# Patient Record
Sex: Female | Born: 1955 | Race: White | Hispanic: No | Marital: Married | Smoking: Never smoker
Health system: Southern US, Community
[De-identification: ages and names within clinical notes are randomized; demographics above are authoritative.]

## PROBLEM LIST (undated history)

## (undated) DIAGNOSIS — E119 Type 2 diabetes mellitus without complications: Secondary | ICD-10-CM

## (undated) DIAGNOSIS — N289 Disorder of kidney and ureter, unspecified: Secondary | ICD-10-CM

## (undated) DIAGNOSIS — D649 Anemia, unspecified: Secondary | ICD-10-CM

## (undated) DIAGNOSIS — J45909 Unspecified asthma, uncomplicated: Secondary | ICD-10-CM

## (undated) DIAGNOSIS — I10 Essential (primary) hypertension: Secondary | ICD-10-CM

## (undated) DIAGNOSIS — M1990 Unspecified osteoarthritis, unspecified site: Secondary | ICD-10-CM

## (undated) DIAGNOSIS — M549 Dorsalgia, unspecified: Secondary | ICD-10-CM

## (undated) HISTORY — PX: REPLACEMENT TOTAL KNEE: SUR1224

## (undated) HISTORY — PX: TOTAL KNEE ARTHROPLASTY: SHX125

## (undated) HISTORY — PX: OTHER SURGICAL HISTORY: SHX169

## (undated) HISTORY — PX: GASTRIC BYPASS: SHX52

## (undated) HISTORY — PX: ABDOMINAL HYSTERECTOMY: SHX81

---

## 2004-07-30 ENCOUNTER — Ambulatory Visit: Payer: Self-pay | Admitting: Unknown Physician Specialty

## 2005-08-18 ENCOUNTER — Emergency Department: Payer: Self-pay | Admitting: Emergency Medicine

## 2006-06-02 ENCOUNTER — Ambulatory Visit: Payer: Self-pay | Admitting: Unknown Physician Specialty

## 2006-06-26 ENCOUNTER — Ambulatory Visit: Payer: Self-pay | Admitting: Unknown Physician Specialty

## 2006-07-27 ENCOUNTER — Ambulatory Visit: Payer: Self-pay | Admitting: Unknown Physician Specialty

## 2006-12-28 ENCOUNTER — Ambulatory Visit: Payer: Self-pay | Admitting: Unknown Physician Specialty

## 2007-01-09 ENCOUNTER — Emergency Department: Payer: Self-pay

## 2007-04-17 IMAGING — RF DG UGI W/ KUB
1 series · 15 of 24 positions shown · non-contrast
Comparison: none

REASON FOR EXAM: GERD
COMMENTS:

[Series 1: run · 15 of 24 slices shown]
[im 1/24]
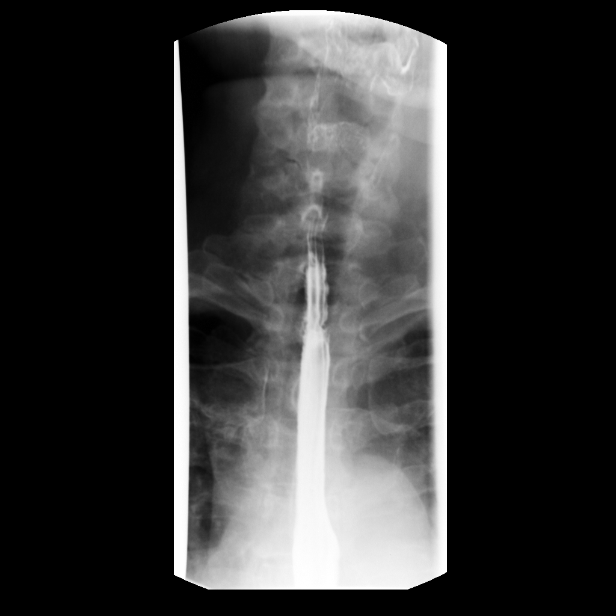
[im 3/24]
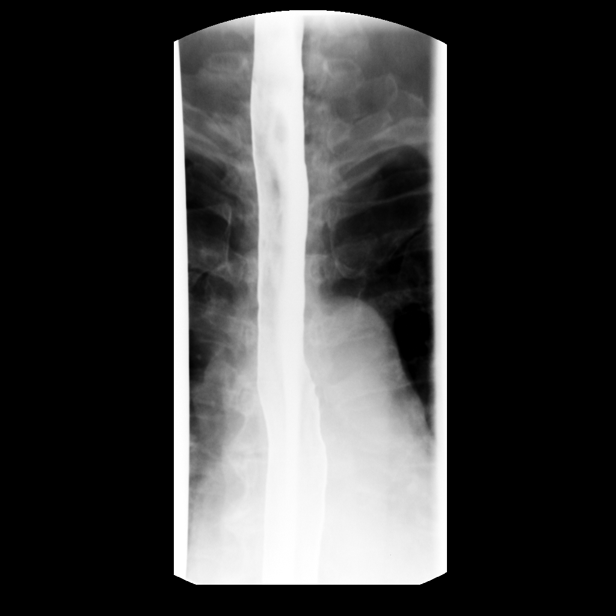
[im 5/24]
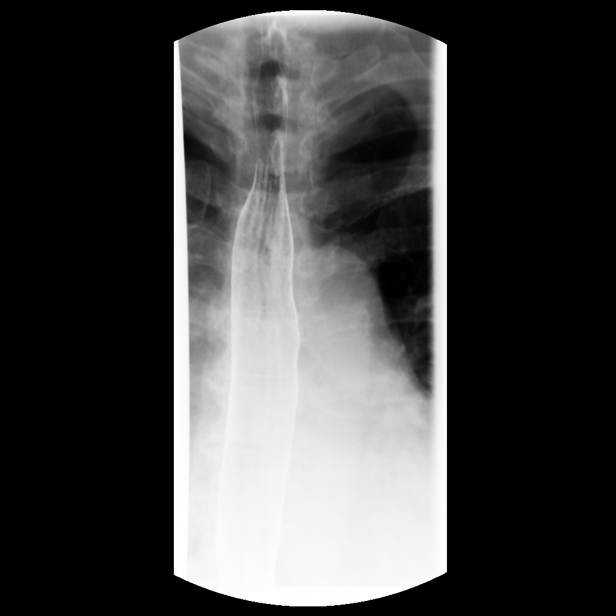
[im 6/24]
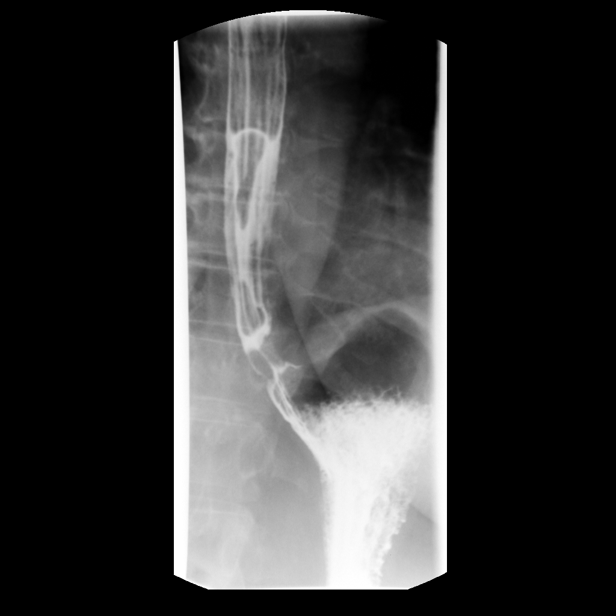
[im 8/24]
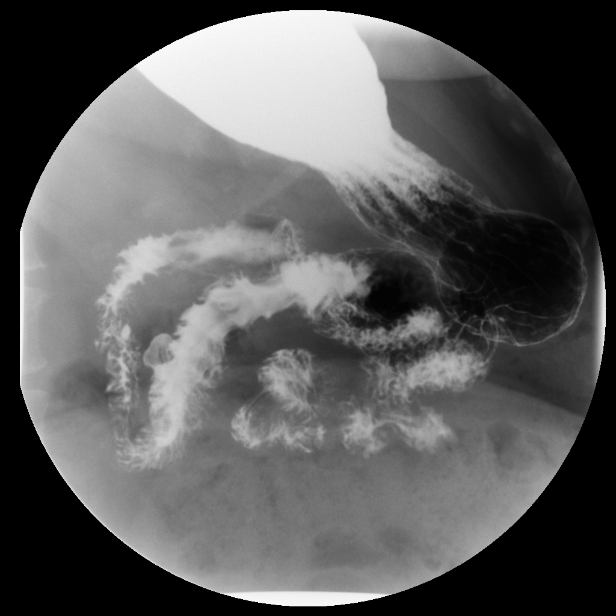
[im 9/24]
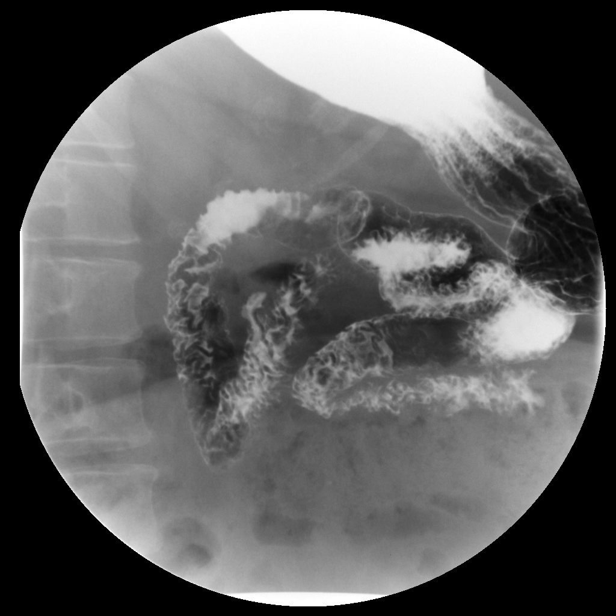
[im 11/24]
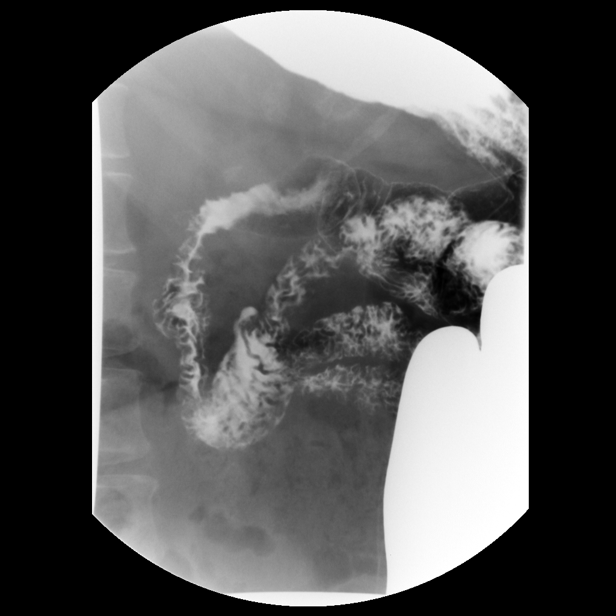
[im 13/24]
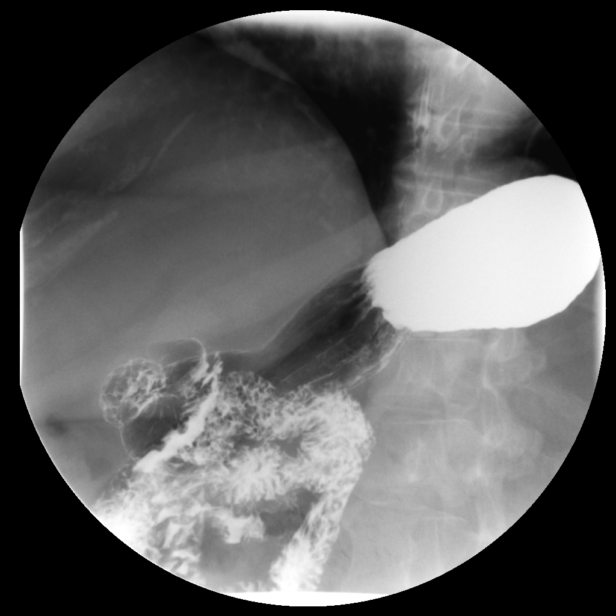
[im 14/24]
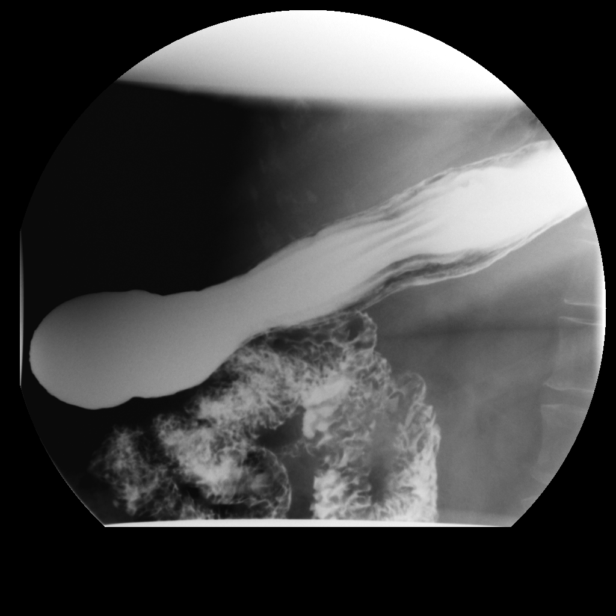
[im 16/24]
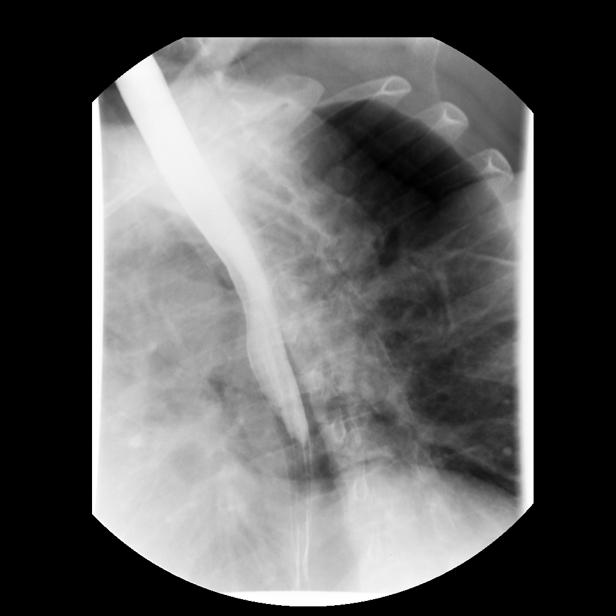
[im 17/24]
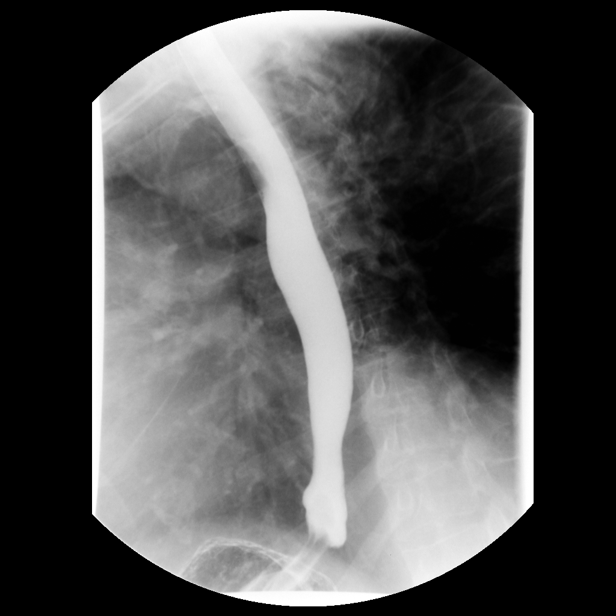
[im 19/24]
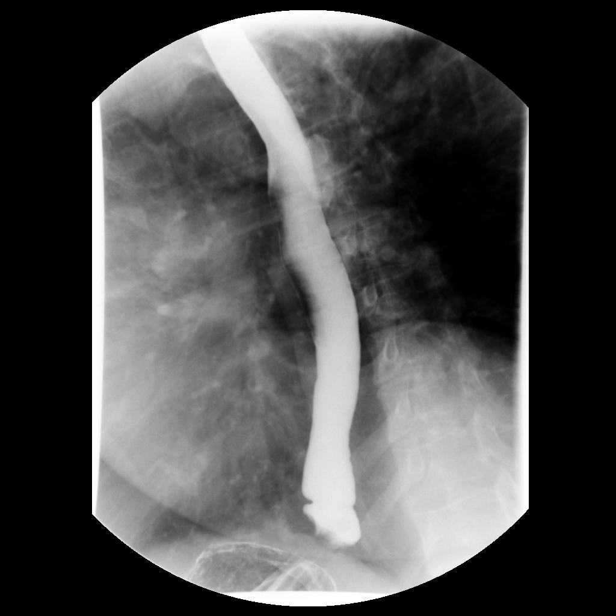
[im 21/24]
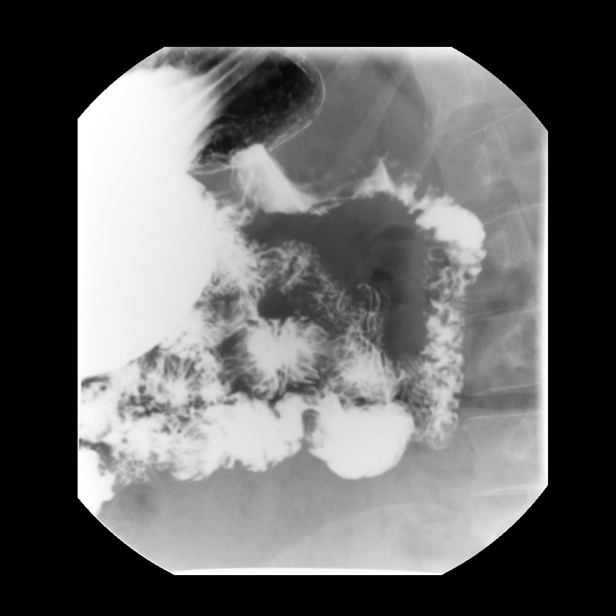
[im 22/24]
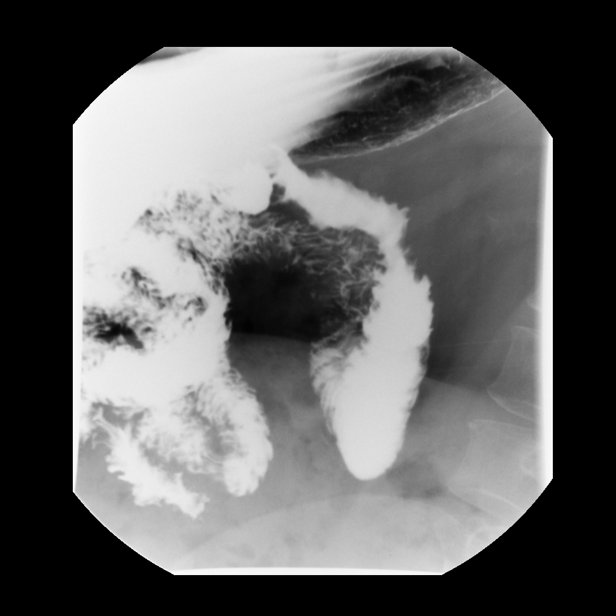
[im 24/24]
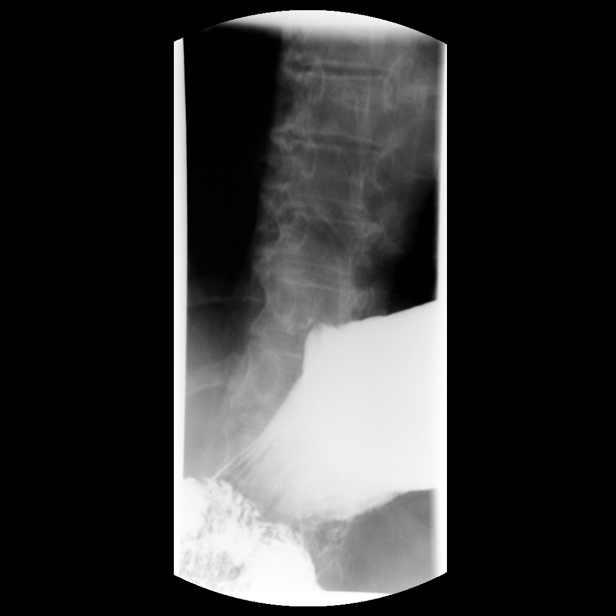

[15 of 24 positions shown; findings below may reference images not displayed]

PROCEDURE:     FL  - FL UPPER GI W/ BARIUM SWALLOW  - June 26, 2006  [DATE]

RESULT:     Barium passed through the esophagus unobstructed with normal
peristaltic activity.  Small intermittent hiatal hernia is identified.  A
small amount of gastroesophageal reflux is noted on water siphon exam.  A
12.5-mm barium impregnated tablet passed through the esophagus into the
stomach unobstructed.

The stomach appears within normal limits.  No ulcer craters are seen. The
stomach emptied without delay.  Duodenal bulb appears within normal limits.
No ulcer craters.  There is seen a small diverticulum in the proximal
transverse duodenum.
IMPRESSION: Small intermittent direct sliding-type hiatal hernia with minimal amount of
gastroesophageal reflux on the water siphon exam.

No ulcer crater is seen.

Small diverticulum in the proximal transverse duodenum.

## 2007-07-01 ENCOUNTER — Ambulatory Visit: Payer: Self-pay | Admitting: Unknown Physician Specialty

## 2007-07-09 ENCOUNTER — Ambulatory Visit: Payer: Self-pay | Admitting: Family Medicine

## 2007-07-09 ENCOUNTER — Other Ambulatory Visit: Payer: Self-pay

## 2007-08-09 ENCOUNTER — Ambulatory Visit: Payer: Self-pay | Admitting: Internal Medicine

## 2007-12-11 ENCOUNTER — Ambulatory Visit: Payer: Self-pay | Admitting: Unknown Physician Specialty

## 2008-06-12 ENCOUNTER — Ambulatory Visit: Payer: Self-pay | Admitting: Internal Medicine

## 2009-01-31 ENCOUNTER — Ambulatory Visit: Payer: Self-pay | Admitting: Internal Medicine

## 2009-04-27 ENCOUNTER — Ambulatory Visit: Payer: Self-pay | Admitting: Unknown Physician Specialty

## 2009-12-09 ENCOUNTER — Ambulatory Visit: Payer: Self-pay | Admitting: Family Medicine

## 2010-05-09 ENCOUNTER — Ambulatory Visit: Payer: Self-pay | Admitting: Unknown Physician Specialty

## 2010-09-03 ENCOUNTER — Encounter
Admission: RE | Admit: 2010-09-03 | Discharge: 2010-09-03 | Payer: Self-pay | Source: Home / Self Care | Attending: Orthopedic Surgery | Admitting: Orthopedic Surgery

## 2011-03-07 ENCOUNTER — Ambulatory Visit: Payer: Self-pay | Admitting: Physical Medicine and Rehabilitation

## 2011-05-23 ENCOUNTER — Ambulatory Visit: Payer: Self-pay | Admitting: Unknown Physician Specialty

## 2011-06-19 ENCOUNTER — Ambulatory Visit: Payer: Self-pay | Admitting: Surgery

## 2011-06-25 ENCOUNTER — Ambulatory Visit: Payer: Self-pay | Admitting: Surgery

## 2011-09-17 ENCOUNTER — Ambulatory Visit: Payer: Self-pay | Admitting: Unknown Physician Specialty

## 2012-03-15 ENCOUNTER — Ambulatory Visit: Payer: Self-pay | Admitting: Unknown Physician Specialty

## 2012-05-28 ENCOUNTER — Ambulatory Visit: Payer: Self-pay | Admitting: Physical Medicine and Rehabilitation

## 2013-03-31 ENCOUNTER — Ambulatory Visit: Payer: Self-pay | Admitting: Unknown Physician Specialty

## 2013-06-29 ENCOUNTER — Ambulatory Visit: Payer: Self-pay | Admitting: Internal Medicine

## 2014-03-02 ENCOUNTER — Other Ambulatory Visit: Payer: Self-pay | Admitting: Physical Medicine and Rehabilitation

## 2014-03-02 DIAGNOSIS — M545 Low back pain, unspecified: Secondary | ICD-10-CM

## 2014-03-02 DIAGNOSIS — M79604 Pain in right leg: Secondary | ICD-10-CM

## 2014-03-09 ENCOUNTER — Ambulatory Visit
Admission: RE | Admit: 2014-03-09 | Discharge: 2014-03-09 | Disposition: A | Discharge status: Home / Self Care | Payer: Medicare Other | Source: Ambulatory Visit | Attending: Physical Medicine and Rehabilitation | Admitting: Physical Medicine and Rehabilitation

## 2014-03-09 ENCOUNTER — Other Ambulatory Visit: Payer: Self-pay

## 2014-03-09 DIAGNOSIS — M545 Low back pain, unspecified: Secondary | ICD-10-CM

## 2014-03-09 DIAGNOSIS — M79604 Pain in right leg: Secondary | ICD-10-CM

## 2015-04-30 ENCOUNTER — Encounter: Payer: Self-pay | Admitting: Emergency Medicine

## 2015-04-30 ENCOUNTER — Emergency Department
Admission: EM | Admit: 2015-04-30 | Discharge: 2015-05-01 | Disposition: A | Discharge status: Home / Self Care | Payer: Medicare Other | Source: Home / Self Care | Attending: Emergency Medicine | Admitting: Emergency Medicine

## 2015-04-30 ENCOUNTER — Ambulatory Visit
Admission: EM | Admit: 2015-04-30 | Discharge: 2015-04-30 | Disposition: A | Discharge status: Home / Self Care | Payer: Medicare Other | Attending: Family Medicine | Admitting: Family Medicine

## 2015-04-30 DIAGNOSIS — I10 Essential (primary) hypertension: Secondary | ICD-10-CM | POA: Insufficient documentation

## 2015-04-30 DIAGNOSIS — R42 Dizziness and giddiness: Secondary | ICD-10-CM | POA: Insufficient documentation

## 2015-04-30 DIAGNOSIS — I951 Orthostatic hypotension: Secondary | ICD-10-CM

## 2015-04-30 DIAGNOSIS — E860 Dehydration: Secondary | ICD-10-CM

## 2015-04-30 DIAGNOSIS — E119 Type 2 diabetes mellitus without complications: Secondary | ICD-10-CM | POA: Insufficient documentation

## 2015-04-30 DIAGNOSIS — E162 Hypoglycemia, unspecified: Secondary | ICD-10-CM | POA: Diagnosis not present

## 2015-04-30 DIAGNOSIS — Z794 Long term (current) use of insulin: Secondary | ICD-10-CM | POA: Diagnosis not present

## 2015-04-30 DIAGNOSIS — N289 Disorder of kidney and ureter, unspecified: Secondary | ICD-10-CM

## 2015-04-30 HISTORY — DX: Type 2 diabetes mellitus without complications: E11.9

## 2015-04-30 HISTORY — DX: Unspecified asthma, uncomplicated: J45.909

## 2015-04-30 HISTORY — DX: Dorsalgia, unspecified: M54.9

## 2015-04-30 HISTORY — DX: Essential (primary) hypertension: I10

## 2015-04-30 HISTORY — DX: Unspecified osteoarthritis, unspecified site: M19.90

## 2015-04-30 HISTORY — DX: Disorder of kidney and ureter, unspecified: N28.9

## 2015-04-30 HISTORY — DX: Anemia, unspecified: D64.9

## 2015-04-30 LAB — COMPREHENSIVE METABOLIC PANEL
ALK PHOS: 67 U/L (ref 38–126)
ALT: 17 U/L (ref 14–54)
ANION GAP: 10 (ref 5–15)
AST: 22 U/L (ref 15–41)
Albumin: 3.8 g/dL (ref 3.5–5.0)
BILIRUBIN TOTAL: 0.3 mg/dL (ref 0.3–1.2)
BUN: 27 mg/dL — ABNORMAL HIGH (ref 6–20)
CALCIUM: 9.4 mg/dL (ref 8.9–10.3)
CO2: 29 mmol/L (ref 22–32)
CREATININE: 1.75 mg/dL — AB (ref 0.44–1.00)
Chloride: 96 mmol/L — ABNORMAL LOW (ref 101–111)
GFR, EST AFRICAN AMERICAN: 36 mL/min — AB (ref >60)
GFR, EST NON AFRICAN AMERICAN: 31 mL/min — AB (ref >60)
Glucose, Bld: 218 mg/dL — ABNORMAL HIGH (ref 65–99)
Potassium: 3.7 mmol/L (ref 3.5–5.1)
SODIUM: 135 mmol/L (ref 135–145)
TOTAL PROTEIN: 7.5 g/dL (ref 6.5–8.1)

## 2015-04-30 LAB — URINALYSIS COMPLETE WITH MICROSCOPIC (ARMC ONLY)
Bilirubin Urine: NEGATIVE
Glucose, UA: NEGATIVE mg/dL
Hgb urine dipstick: NEGATIVE
KETONES UR: NEGATIVE mg/dL
LEUKOCYTES UA: NEGATIVE
Nitrite: NEGATIVE
PROTEIN: NEGATIVE mg/dL
RBC / HPF: NONE SEEN RBC/hpf (ref <3)
pH: 5 (ref 5.0–8.0)

## 2015-04-30 LAB — CBC WITH DIFFERENTIAL/PLATELET
Basophils Absolute: 0.1 10*3/uL (ref 0–0.1)
Basophils Relative: 1 %
EOS ABS: 0.2 10*3/uL (ref 0–0.7)
Eosinophils Relative: 2 %
HEMATOCRIT: 38 % (ref 35.0–47.0)
HEMOGLOBIN: 12.7 g/dL (ref 12.0–16.0)
LYMPHS ABS: 2.9 10*3/uL (ref 1.0–3.6)
LYMPHS PCT: 37 %
MCH: 30.8 pg (ref 26.0–34.0)
MCHC: 33.4 g/dL (ref 32.0–36.0)
MCV: 92.3 fL (ref 80.0–100.0)
MONOS PCT: 8 %
Monocytes Absolute: 0.7 10*3/uL (ref 0.2–0.9)
NEUTROS PCT: 52 %
Neutro Abs: 4.1 10*3/uL (ref 1.4–6.5)
Platelets: 227 10*3/uL (ref 150–440)
RBC: 4.12 MIL/uL (ref 3.80–5.20)
RDW: 13.1 % (ref 11.5–14.5)
WBC: 8 10*3/uL (ref 3.6–11.0)

## 2015-04-30 LAB — GLUCOSE, CAPILLARY
Glucose-Capillary: 43 mg/dL — CL (ref 65–99)
Glucose-Capillary: 61 mg/dL — ABNORMAL LOW (ref 65–99)
Glucose-Capillary: 131 mg/dL — ABNORMAL HIGH (ref 65–99)

## 2015-04-30 MED ORDER — SODIUM CHLORIDE 0.9 % IV BOLUS (SEPSIS)
1000.0000 mL | Freq: Once | INTRAVENOUS | Status: AC
  Administered 2015-04-30: 1000 mL via INTRAVENOUS

## 2015-04-30 NOTE — ED Provider Notes (Signed)
[facility] Emergency Department Provider Note  ____________________________________________  Time seen: 2155   I have reviewed the triage vital signs and the nursing notes.   HISTORY  Chief Complaint Dizziness  near-syncope and syncope    HPI Caitlin Larson is a 59 y.o. female who reports that she has been having episodes of weakness, near syncope, and syncope, for 2-3 months.  This is worse when she is standing. She sometimes feels it when she first stands up, but sometimes she feels it when she has been up and is walking. She had an episode approximate 3 days ago where she did have a full syncopal episode. She reports she hit her head lightly, but does not think that she had a significant injury. She reports she would've come to the hospital if she was worried about her head.  At around 4:00 this afternoon, she had another one of her weak spells and near syncope. She went to [facility] urgent care for further evaluation. They noted that she had a low blood sugar of 41. The patient is a diabetic and does take insulin. See set that she didn't get a chance to eat enough. They were able to raise the patient's glucose with juice and crackers.  [place] urgent care performed orthostatic vital signs. The patient was moderately hypertensive with a systolic pressure approximately 175. When she stood up this dropped to 145. Her heart rate remained nearly the same between 58 and 62.  She does have a history of renal insufficiency. Her renal function tests show minimal elevation today.  She was sent to the emergency department for further evaluation and for consideration of a CT scan of her head.      Past Medical History  Diagnosis Date  . Arthritis   . Diabetes mellitus without complication   . Hypertension   . Renal disorder   . Back pain   . Anemia   . Asthma     There are no active problems to display for this patient.   Past Surgical History  Procedure  Laterality Date  . Replacement total knee Bilateral   . Carpel tunnel surgery Right   . Abdominal hysterectomy    . Gastric bypass    . Total knee arthroplasty    . Gastric bypass      Current Outpatient Rx  Name  Route  Sig  Dispense  Refill  . insulin detemir (LEVEMIR) 100 UNIT/ML injection   Subcutaneous   Inject into the skin at bedtime.         Marland Kitchen lisinopril (PRINIVIL,ZESTRIL) 10 MG tablet   Oral   Take 10 mg by mouth daily.         Marland Kitchen LISINOPRIL PO   Oral   Take by mouth.         . OXYCODONE HCL PO   Oral   Take by mouth.           Allergies Review of patient's allergies indicates no known allergies.  History reviewed. No pertinent family history.  Social History Social History  Substance Use Topics  . Smoking status: Never Smoker   . Smokeless tobacco: None  . Alcohol Use: No    Review of Systems  Constitutional: Negative for fever. ENT: Negative for sore throat. Cardiovascular: Negative for chest pain. Positive for orthostatic hypotension and near syncope Respiratory: Negative for shortness of breath. Gastrointestinal: Negative for abdominal pain, vomiting and diarrhea. Genitourinary: Negative for dysuria. Musculoskeletal: No myalgias or injuries. Skin: Negative  for rash. Neurological: Negative for headaches   10-point ROS otherwise negative.  ____________________________________________   PHYSICAL EXAM:  VITAL SIGNS: ED Triage Vitals  Enc Vitals Group     BP --      Pulse --      Resp --      Temp 04/30/15 2136 98.1 F (36.7 C)     Temp Source 04/30/15 2136 Oral     SpO2 04/30/15 2137 97 %     Weight --      Height --      Head Cir --      Peak Flow --      Pain Score 04/30/15 2143 4     Pain Loc --      Pain Edu? --      Excl. in GC? --     Constitutional:  Alert and oriented. Well appearing and in no distress. ENT   Head: Normocephalic and atraumatic.   Nose: No congestion/rhinnorhea.   Mouth/Throat:  Mucous membranes are moist. Cardiovascular: Normal rate at 60, regular rhythm, no murmur noted Respiratory:  Normal respiratory effort, no tachypnea.    Breath sounds are clear and equal bilaterally.  Gastrointestinal: Soft and nontender. No distention.  Back: No muscle spasm, no tenderness, no CVA tenderness. Musculoskeletal: No deformity noted. Nontender with normal range of motion in all extremities.  No noted edema. Neurologic:  Normal speech and language. 5 over 5 strength in all 4 extremities. Equal grip strength. Negative pronator drift. Good finger to nose motion. No gross focal neurologic deficits are appreciated.  Skin:  Skin is warm, dry. No rash noted. Psychiatric: Mood and affect are normal. Speech and behavior are normal.  ____________________________________________    LABS (pertinent positives/negatives)  Performed at [facility] urgent care. This includes a BUN of 27 and a creatinine of 1.79   ____________________________________________   EKG  ED ECG REPORT I, Ty Buntrock W, the attending physician, personally viewed and interpreted this ECG.   Date: 04/30/2015  EKG Time: 1816  Rate: 54  Rhythm: Sinus bradycardia with left ventricular hypertrophy  Axis: Normal  Intervals: Normal  ST&T Change: Slight downward direction of T-wave in lead 3 and a flat T-wave in aVF   ____________________________________________     INITIAL IMPRESSION / ASSESSMENT AND PLAN / ED COURSE  Pertinent labs & imaging results that were available during my care of the patient were reviewed by me and considered in my medical decision making (see chart for details).  Well-appearing 59 year old female in no acute distress. She reports episodes that sound like they're caused by hypotension. We have reviewed her medication list. She is on 4 different blood pressure medications including carvedilol. I'm concerned she may have too much beta blockade. Her heart rate does not change and stays  right around 60 regard this of position or activity. She has slight elevation in her renal function and she is having orthostatic hypotension. We will treat her with 1 L of normal saline and reassess her orthostatic vital signs.  She has no neurologic deficit. She has no headache or evidence of head injury. While she was sent here for consideration of head CT, I do not see an indication for this. The patient agrees and would draw the not have a head CT.  ----------------------------------------- 11:40 PM on 04/30/2015 -----------------------------------------  Reassessment finds the patient comfortable. She has received 700 ML's's of the liter of normal saline ordered.   I have reassessed her orthostatic vital signs. Lying down her heart rate  was 60 with a blood pressure of 171/81. Standing her heart rate went to 66 with a blood pressure of 160/84. The patient had no weakness or near syncopal symptoms while standing.  This is significantly improved from the orthostatic readings earlier today at [facility] urgent care.   I have discussed water intake with the patient. She reports that she drinks 3 bottles of water a day usually. She reports she did not drink any today. The patient and I both agree that this may have led to her more notable near syncopal episode at around 4:00.  I have advised the patient to reduce her Coreg dose in half. She reports she has a pill cutter and we'll do this. She'll make her dose of 6.25 mg. I've asked her to do that twice a day.  After this liter of saline finishes infusing, she will be able to go home and follow-up with her primary physician Dr. Audelia Acton.  ____________________________________________   FINAL CLINICAL IMPRESSION(S) / ED DIAGNOSES  Final diagnoses:  Orthostatic hypotension  Renal insufficiency  Mild dehydration      Darien Ramus, MD 05/01/15 0006

## 2015-04-30 NOTE — ED Notes (Signed)
Pt arrived to the ED via EMS from [facility] for dizziness x2 months. EMS reports that the Pt's BG was 41 at Urgent care and they were able to bring it up to the 200's with "soda and crackers." Pt states that she fell about 1 week ago denying LOC. Pt was referred to the ED for a CT of the head. Pt is AOx4 in no apparent distress, no deficiencies noted. Pt is a renal Pt and a diabetic.

## 2015-04-30 NOTE — ED Notes (Signed)
Pt also reports she feels her heart flutters sometimes.

## 2015-04-30 NOTE — ED Notes (Signed)
FSBG 43

## 2015-04-30 NOTE — ED Notes (Signed)
Pt reports she also takes pain meds for chronic pain all over, arthritis and back. Spouse states he thinks she takes too much pain meds, falls asleep a lot.

## 2015-04-30 NOTE — ED Notes (Signed)
Pt reports 2 months of dizzyness and event of syncope last week , Wed or Thurs, reports she hit her head on the sink. Previous syncope. Pt seen by Dr. Prince Solian at [place] [facility] , she takes BP medicine , and her doctor said  Her dizzyness is due to the BP meds.  Pt reports feeling chilled often. Denies pain.

## 2015-04-30 NOTE — ED Provider Notes (Signed)
[facility] Emergency Department Provider Note  ____________________________________________  Time seen: Approximately 8:09 PM  I have reviewed the triage vital signs and the nursing notes.   HISTORY  Chief Complaint Dizziness   HPI Caitlin Larson is a 59 y.o. female presents for complaints of dizziness. Husband at bedside. Reports dizziness x several months but states worse today. States dizziness today has been more persistent, almost constant. States worse with position changes but present without as well/ Reports feeling weak. States history of dizziness and followed by PCP Dr Harrington Challenger and reports last seen one month ago and was felt due to blood pressure medication and lowered norvasc to 2.5 mg. Reports syncope once several months ago, and then once last week. States multiple times of near syncope but was able to sit down prior to passing out. Reports last week syncope while walking in bathroom and fell, hit posterior head on sink , States woke up as soon as she hit the floor. States has not been evaluated since last fall. Denies family witnessing falls.    States feels tired and cold recently with weakness. States intermittent heart fluttering feeling and palpitations, more today. Denies correlation of heart palpitations and dizziness. Reports continues to eat and drink well daily. States monitors blood glucose daily with last  Check earlier this afternoon and reports glucose was in low 200s. States ate multiple meals today. Reports chronic low back and neck pain, denies changes in pain. States take oxycodone 7.5 mg 3-4 times per day for pain without recent changes. Denies current chest pain, chest discomfort, shortness of breath.  Reports current dizziness, weakness.  Denies recent medication changes.    Past Medical History  Diagnosis Date  . Arthritis   . Diabetes mellitus without complication   . Hypertension   . Renal disorder   . Back pain   . Anemia   .  Asthma   chronic lumbar pain chronic neck pain.   There are no active problems to display for this patient.   Past Surgical History  Procedure Laterality Date  . Replacement total knee Bilateral   . Carpel tunnel surgery Right   . Abdominal hysterectomy    . Gastric bypass    . Total knee arthroplasty    . Gastric bypass      Current Outpatient Rx  Name  Route  Sig  Dispense  Refill  . insulin detemir (LEVEMIR) 100 UNIT/ML injection   Subcutaneous 34 u  Inject into the skin at bedtime.         Marland Kitchen lisinopril (PRINIVIL,ZESTRIL) 10 MG tablet   Oral   Take 10 mg by mouth daily.         Marland Kitchen LISINOPRIL PO   Oral   Take by mouth.         . OXYCODONE HCL PO   Oral   Take by mouth.         Coreg 12.5 bid glimepiride 4 mg BID Zantac hctz mg q day ambien zocor  actos 30 mg q day Lisinopril 40 mg q day  Allergies Review of patient's allergies indicates no known allergies.  History reviewed. No pertinent family history.  Social History Social History  Substance Use Topics  . Smoking status: Never Smoker   . Smokeless tobacco: None  . Alcohol Use: No    Review of Systems Constitutional: No fever/chills Denies recent sickness.  Eyes: No visual changes. ENT: No sore throat. Cardiovascular: Denies chest pain. REports intermittent chest fluttering and  palpitations.  Respiratory: Denies shortness of breath. Gastrointestinal: No abdominal pain.  No nausea, no vomiting.  No diarrhea.  No constipation. Genitourinary: Negative for dysuria. Musculoskeletal: Negative for back pain. Skin: Negative for rash. Neurological: Negative for headaches or numbness.Positive for dizziness and weakness as above.   10-point ROS otherwise negative.  ____________________________________________   PHYSICAL EXAM:  VITAL SIGNS: ED Triage Vitals  Enc Vitals Group     BP 04/30/15 1848 165/92 mmHg     Pulse Rate 04/30/15 1848 55     Resp 04/30/15 1848 16     Temp 04/30/15 1848  97.7 F (36.5 C)     Temp Source 04/30/15 1848 Oral     SpO2 04/30/15 1848 100 %     Weight 04/30/15 1848 220 lb (99.791 kg)     Height 04/30/15 1848  (1.626 m)     Head Cir --      Peak Flow --      Pain Score 04/30/15 1855 6     Pain Loc --      Pain Edu? --      Excl. in GC? --    Today's Vitals   04/30/15 1848 04/30/15 1855 04/30/15 2026 04/30/15 2056  BP: 165/92  183/89   Pulse: 55  55   Temp: 97.7 F (36.5 C)  97.9 F (36.6 C)   TempSrc: Oral  Oral   Resp: 16  18   Height:  (1.626 m)     Weight: 220 lb (99.791 kg)     SpO2: 100%  100%   PainSc:  6   3   Orthostatic Lying  - BP- Lying: 175/82 mmHg ; Pulse- Lying: 53  Orthostatic Sitting - BP- Sitting: 179/83 mmHg (dizzy upon sitting up ) ; Pulse- Sitting: 57  Orthostatic Standing at 0 minutes - BP- Standing at 0 minutes: 145/82 mmHg (just a little dizzy upon standing from sitting ) ; Pulse- Standing at 0 minutes: 62  Constitutional: Alert and oriented. Well appearing and in no acute distress. Eyes: Conjunctivae are normal. PERRL. EOMI. Head: Atraumatic.Nontender to palpation.   Ears: no erythema, normal TMs bilaterally.   Nose: No congestion/rhinnorhea.  Mouth/Throat: Mucous membranes are moist.  Oropharynx non-erythematous. Neck: No stridor.  Minimal cervical spine TTP, per patient chronic and unchanged from baseline.  Hematological/Lymphatic/Immunilogical: No cervical lymphadenopathy. Cardiovascular: Normal rate, regular rhythm. Grossly normal heart sounds.  Good peripheral circulation. Respiratory: Normal respiratory effort.  No retractions. Lungs CTAB. Gastrointestinal: Soft and nontender. No distention. Normal Bowel sounds.  No abdominal bruits. No CVA tenderness. Musculoskeletal: No lower or upper extremity tenderness , bilateral lower extremity edema, nonpitting.  No joint effusions. Bilateral pedal pulses equal and easily palpated. No thoracic TTP. Mild to mod midline lumbar TTP, per patient chronic and  unchanged.  Neurologic:  Normal speech and language. No gross focal neurologic deficits are appreciated. No gait instability. Negative romberg. No pronator drift. Steady gait.  Skin:  Skin is warm, dry and intact. No rash noted. Psychiatric: Mood and affect are normal. Speech and behavior are normal.  ____________________________________________   LABS (all labs ordered are listed, but only abnormal results are displayed)  Labs Reviewed  URINALYSIS COMPLETEWITH MICROSCOPIC ([facility] ONLY) - Abnormal; Notable for the following:    Specific Gravity, Urine <1.005 (*)    Bacteria, UA FEW (*)    Squamous Epithelial / LPF 6-30 (*)    All other components within normal limits  GLUCOSE, CAPILLARY - Abnormal; Notable for the following:  Glucose-Capillary 43 (*)    All other components within normal limits  GLUCOSE, CAPILLARY - Abnormal; Notable for the following:    Glucose-Capillary 61 (*)    All other components within normal limits  COMPREHENSIVE METABOLIC PANEL - Abnormal; Notable for the following:    Chloride 96 (*)    Glucose, Bld 218 (*)    BUN 27 (*)    Creatinine, Ser 1.75 (*)    GFR calc non Af Amer 31 (*)    GFR calc Af Amer 36 (*)    All other components within normal limits  GLUCOSE, CAPILLARY - Abnormal; Notable for the following:    Glucose-Capillary 131 (*)    All other components within normal limits  CBC WITH DIFFERENTIAL/PLATELET   ____________________________________________  EKG  Interpreted by Dr Judd Gaudier 54 bpm sinus bradycardia, possible left atrial enlargement.  Compared to previous EKG   ____________________________________________   INITIAL IMPRESSION / ASSESSMENT AND PLAN / ED COURSE  Pertinent labs & imaging results that were available during my care of the patient were reviewed by me and considered in my medical decision making (see chart for details).  The patient presents with husband present for the complaints of dizziness which has been present  times months but acute increased. Reports dizziness worse today and more persistent. States syncope 2 with most recent being last week unwitnessed. Patient reports multiple episodes of near syncope. Recent head injury that has not been further evaluated. Patient on multiple medications. Including multiple blood pressure medications. Patient remains hypertensive with heart rates 55-60. Concerning in regards to too many blood pressure medications for patient including also chronic renal insufficiency. Patient also reports with weakness. Orthostatic hypotensive. Reports intermittent heart fluttering and palpitations. Patient blood glucose 43 upon arrival to urgent care. Patient ate and drank with a glucose now 131. Patient states glucose changes did not change dizziness.  Urine with few bacteria. Doubt UTI cause of dizziness. Patient denies dysuria. Culture urine. PAtient states not a clean sample and does not think she has a UTI.    Concerning regarding recent head injury and acute increase of recent dizziness. At this time feel that further evaluation including CT of head is needed and cardiac monitoring and evaluation. Unable to CT at this facility at this time. Discussed patient and plan of care with Dr. Judd Gaudier who also agrees with plan of care. Counseled patient for need for further evaluation including proceeding to ER for further evaluation. Patient to go to ER of choice. Patient alert and oriented with decisional capacity.  Discussed with patient and spouse at bedside and opt to proceed to [facility] via EMS. [facility] called and report given to charge nurse Judie Grieve. EMS at bedside to transport patient. Patient verbalized understanding and agrees to plan.Counseled regarding importance of close PCP follow up.  ____________________________________________   FINAL CLINICAL IMPRESSION(S) / ED DIAGNOSES  Final diagnoses:  Dizziness  Hypoglycemia       Renford Dills, NP 04/30/15  2325

## 2015-04-30 NOTE — ED Notes (Signed)
Pt drank a cola and ate graham crackers after first glucose fingerstick. Second one was 61. So pt given more cola and graham crackers.

## 2015-05-01 NOTE — Discharge Instructions (Signed)
Continue to drink plenty of water-3 bottles a day as you have been doing or little more. Decrease your Coreg (carvedilol) to a half pill (6.25) twice a day. Follow-up with Dr. Audelia Acton this week to review this change in your medication and for reassessment. Return to the emergency department if you have further fainting spells, if you have weakness, or give other urgent concerns.  Orthostatic Hypotension Orthostatic hypotension is a sudden drop in blood pressure. It happens when you quickly stand up from a seated or lying position. You may feel dizzy or light-headed. This can last for just a few seconds or for up to a few minutes. It is usually not a serious problem. However, if this happens frequently or gets worse, it can be a sign of something more serious. CAUSES  Different things can cause orthostatic hypotension, including:   Loss of body fluids (dehydration).  Medicines that lower blood pressure.  Sudden changes in posture, such as standing up quickly after you have been sitting or lying down.  Taking too much of your medicine. SIGNS AND SYMPTOMS   Light-headedness or dizziness.   Fainting or near-fainting.   A fast heart rate.   Weakness.   Feeling tired (fatigue).  DIAGNOSIS  Your health care provider may do several things to help diagnose your condition and identify the cause. These may include:   Taking a medical history and doing a physical exam.  Checking your blood pressure. Your health care provider will check your blood pressure when you are:  Lying down.  Sitting.  Standing.  Using tilt table testing. In this test, you lie down on a table that moves from a lying position to a standing position. You will be strapped onto the table. This test monitors your blood pressure and heart rate when you are in different positions. TREATMENT  Treatment will vary depending on the cause. Possible treatments include:   Changing the dosage of your  medicines.  Wearing compression stockings on your lower legs.  Standing up slowly after sitting or lying down.  Eating more salt.  Eating frequent, small meals.  In some cases, getting IV fluids.  Taking medicine to enhance fluid retention. HOME CARE INSTRUCTIONS  Only take over-the-counter or prescription medicines as directed by your health care provider.  Follow your health care provider's instructions for changing the dosage of your current medicines.  Do not stop or adjust your medicine on your own.  Stand up slowly after sitting or lying down. This allows your body to adjust to the different position.  Wear compression stockings as directed.  Eat extra salt as directed.  Do not add extra salt to your diet unless directed to by your health care provider.  Eat frequent, small meals.  Avoid standing suddenly after eating.  Avoid hot showers or excessive heat as directed by your health care provider.  Keep all follow-up appointments. SEEK MEDICAL CARE IF:  You continue to feel dizzy or light-headed after standing.  You feel groggy or confused.  You feel cold, clammy, or sick to your stomach (nauseous).  You have blurred vision.  You feel short of breath. SEEK IMMEDIATE MEDICAL CARE IF:   You faint after standing.  You have chest pain.  You have difficulty breathing.   You lose feeling or movement in your arms or legs.   You have slurred speech or difficulty talking, or you are unable to talk.  MAKE SURE YOU:   Understand these instructions.  Will watch your condition.  Will get help right away if you are not doing well or get worse. Document Released: 07/25/2002 Document Revised: 08/09/2013 Document Reviewed: 05/27/2013 [facility] Patient Information 2015 [place], [place]. This information is not intended to replace advice given to you by your health care provider. Make sure you discuss any questions you have with your health care provider.

## 2015-07-23 ENCOUNTER — Other Ambulatory Visit: Payer: Self-pay | Admitting: Internal Medicine

## 2015-07-23 DIAGNOSIS — Z1231 Encounter for screening mammogram for malignant neoplasm of breast: Secondary | ICD-10-CM

## 2017-09-08 ENCOUNTER — Other Ambulatory Visit: Payer: Self-pay | Admitting: Physical Medicine and Rehabilitation

## 2017-09-08 DIAGNOSIS — M5412 Radiculopathy, cervical region: Secondary | ICD-10-CM

## 2017-09-16 ENCOUNTER — Ambulatory Visit
Admission: RE | Admit: 2017-09-16 | Discharge: 2017-09-16 | Disposition: A | Discharge status: Home / Self Care | Payer: Medicare Other | Source: Ambulatory Visit | Attending: Physical Medicine and Rehabilitation | Admitting: Physical Medicine and Rehabilitation

## 2017-09-16 DIAGNOSIS — M4802 Spinal stenosis, cervical region: Secondary | ICD-10-CM | POA: Insufficient documentation

## 2017-09-16 DIAGNOSIS — M5412 Radiculopathy, cervical region: Secondary | ICD-10-CM | POA: Diagnosis present

## 2019-09-20 ENCOUNTER — Other Ambulatory Visit: Payer: Self-pay

## 2019-09-20 ENCOUNTER — Ambulatory Visit: Payer: Medicare Other | Attending: Internal Medicine

## 2019-09-20 DIAGNOSIS — Z20822 Contact with and (suspected) exposure to covid-19: Secondary | ICD-10-CM

## 2019-09-21 LAB — NOVEL CORONAVIRUS, NAA: SARS-CoV-2, NAA: DETECTED — AB

## 2019-09-22 ENCOUNTER — Telehealth: Payer: Self-pay | Admitting: Nurse Practitioner

## 2019-09-22 NOTE — Telephone Encounter (Signed)
Called to Discuss with patient about Covid symptoms and the use of bamlanivimab, a monoclonal antibody infusion for those with mild to moderate Covid symptoms and at a high risk of hospitalization.     Pt is qualified for this infusion at the [facility] due to co-morbid conditions and/or a member of an at-risk group.     Unable to reach pt  

## 2021-02-06 ENCOUNTER — Other Ambulatory Visit: Payer: Self-pay | Admitting: Family Medicine

## 2021-02-06 DIAGNOSIS — R921 Mammographic calcification found on diagnostic imaging of breast: Secondary | ICD-10-CM

## 2021-02-15 ENCOUNTER — Ambulatory Visit
Admission: RE | Admit: 2021-02-15 | Discharge: 2021-02-15 | Disposition: A | Discharge status: Home / Self Care | Payer: Medicare Other | Source: Ambulatory Visit | Attending: Family Medicine | Admitting: Family Medicine

## 2021-02-15 ENCOUNTER — Other Ambulatory Visit: Payer: Self-pay

## 2021-02-15 DIAGNOSIS — R921 Mammographic calcification found on diagnostic imaging of breast: Secondary | ICD-10-CM

## 2021-08-29 ENCOUNTER — Other Ambulatory Visit (HOSPITAL_COMMUNITY): Payer: Self-pay | Admitting: Nephrology

## 2021-08-29 ENCOUNTER — Other Ambulatory Visit: Payer: Self-pay | Admitting: Nephrology

## 2021-08-29 DIAGNOSIS — N189 Chronic kidney disease, unspecified: Secondary | ICD-10-CM

## 2021-09-05 ENCOUNTER — Ambulatory Visit (HOSPITAL_COMMUNITY)
Admission: RE | Admit: 2021-09-05 | Discharge: 2021-09-05 | Disposition: A | Discharge status: Home / Self Care | Payer: Medicare Other | Source: Ambulatory Visit | Attending: Nephrology | Admitting: Nephrology

## 2021-09-05 ENCOUNTER — Other Ambulatory Visit: Payer: Self-pay

## 2021-09-05 DIAGNOSIS — N189 Chronic kidney disease, unspecified: Secondary | ICD-10-CM | POA: Insufficient documentation

## 2021-12-07 IMAGING — MG MM BREAST BX W LOC DEV 1ST LESION IMAGE BX SPEC STEREO GUIDE*L*
6 of 10 series · 6 of 26 positions shown · non-contrast
Comparison: Previous exams.
COMPARISON: Previous exams.

Addendum:
CLINICAL DATA: Patient presents for stereotactic guided core biopsy
of LEFT breast calcifications.

EXAM:
LEFT BREAST STEREOTACTIC CORE NEEDLE BIOPSY

[L (1 of 4)]
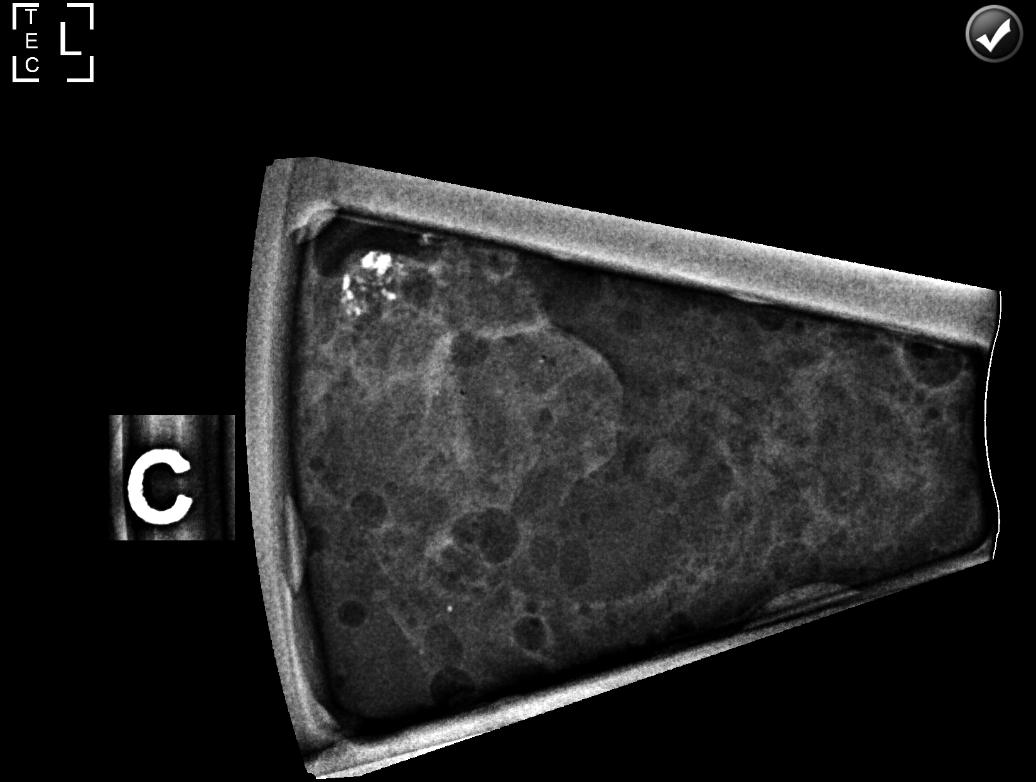

[L (2 of 4)]
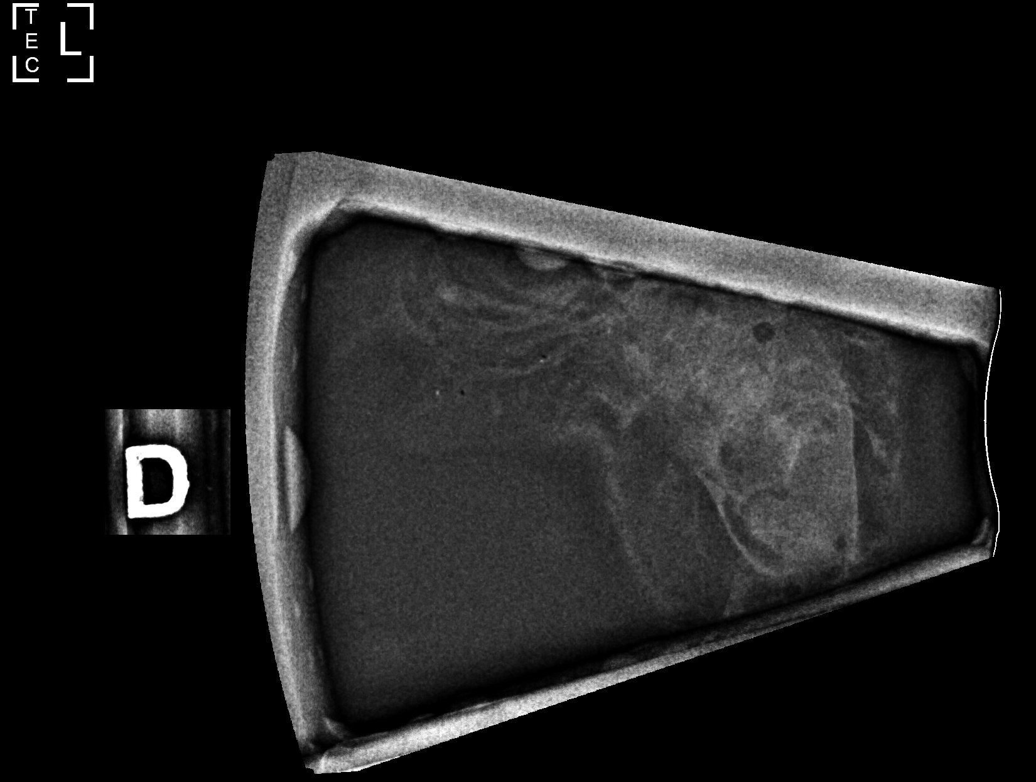

[L (3 of 4)]
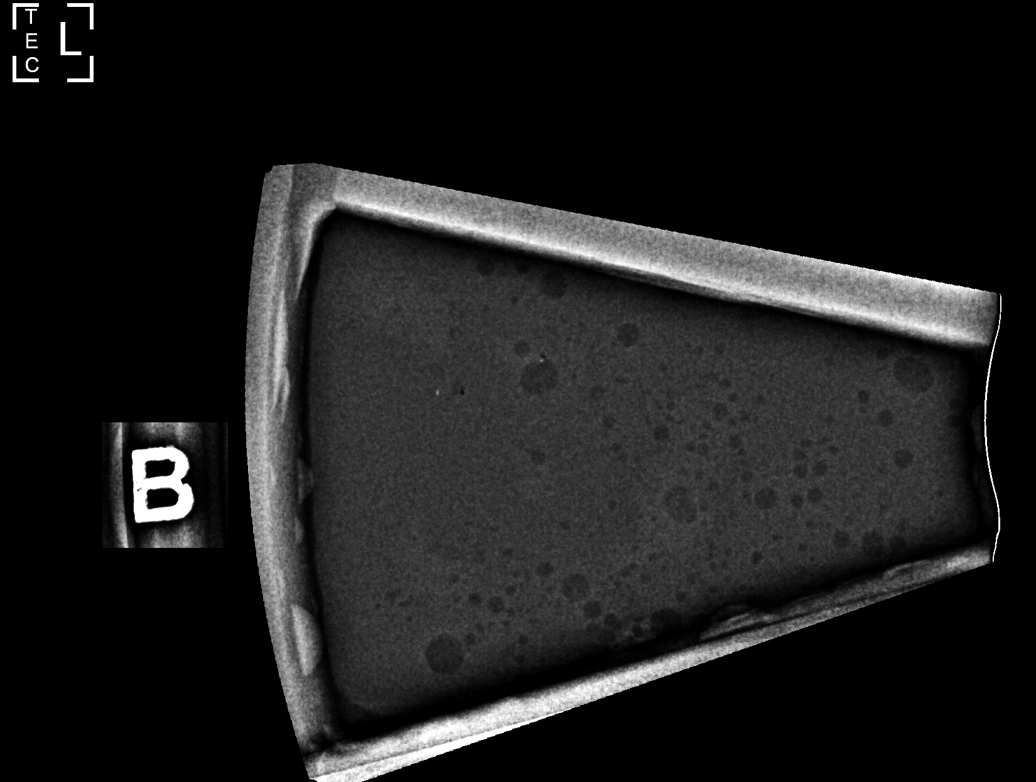

[L (4 of 4)]
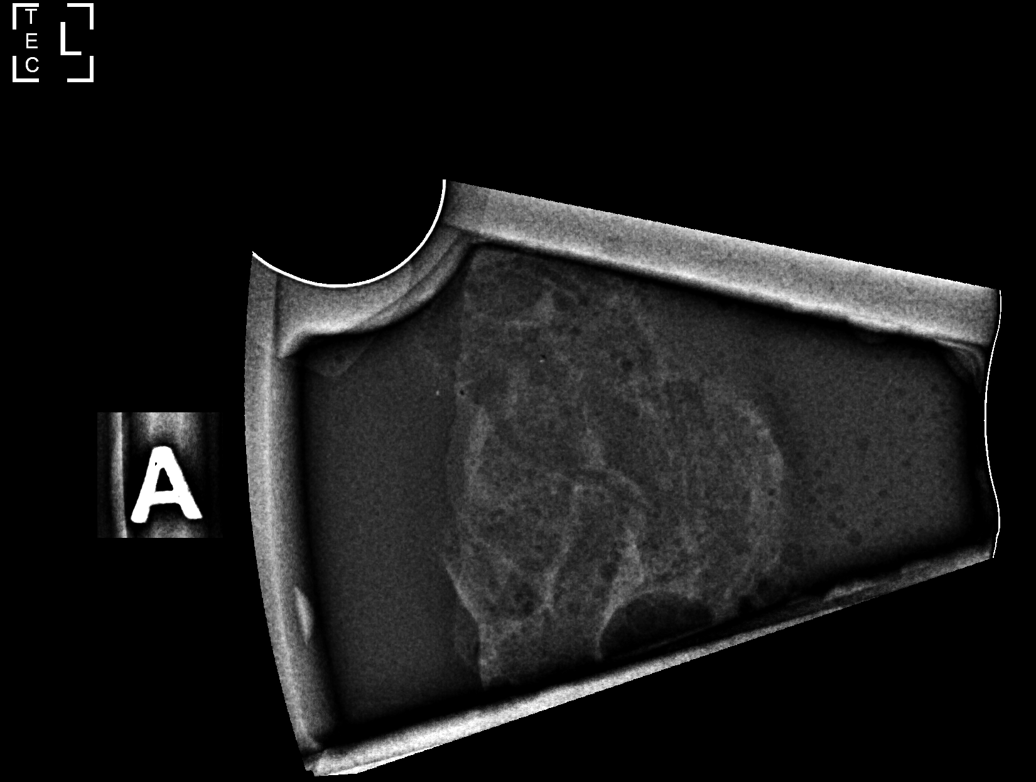

[L LM (1 of 2)]
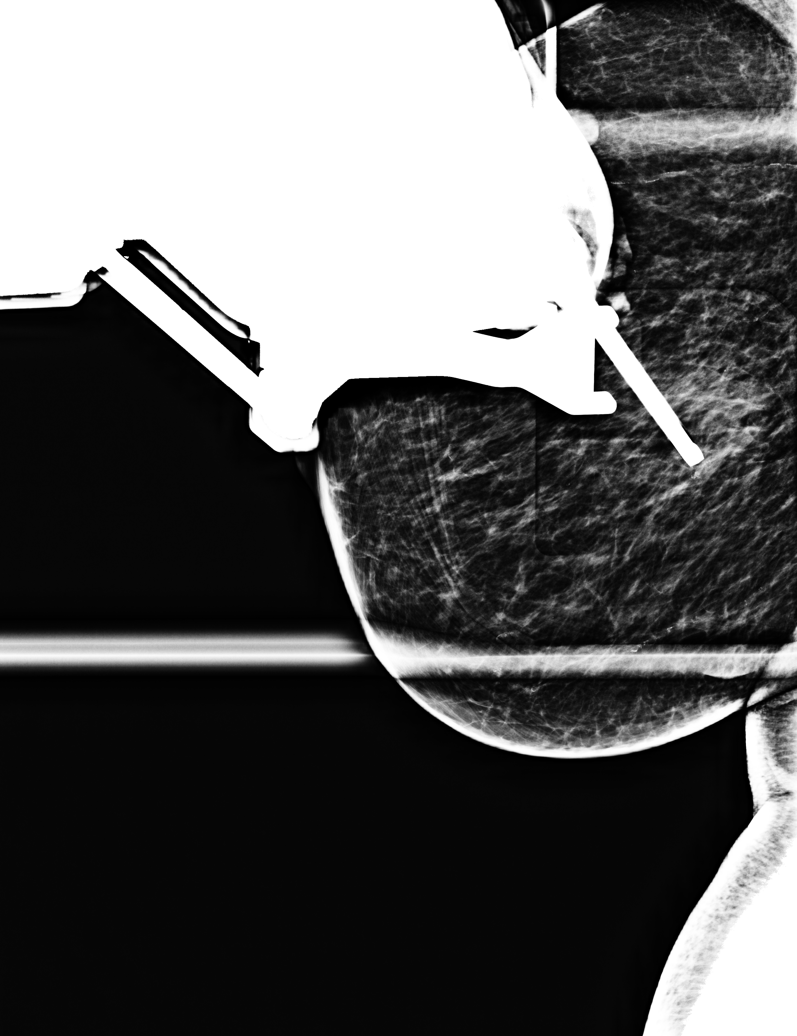

[L LM (2 of 2)]
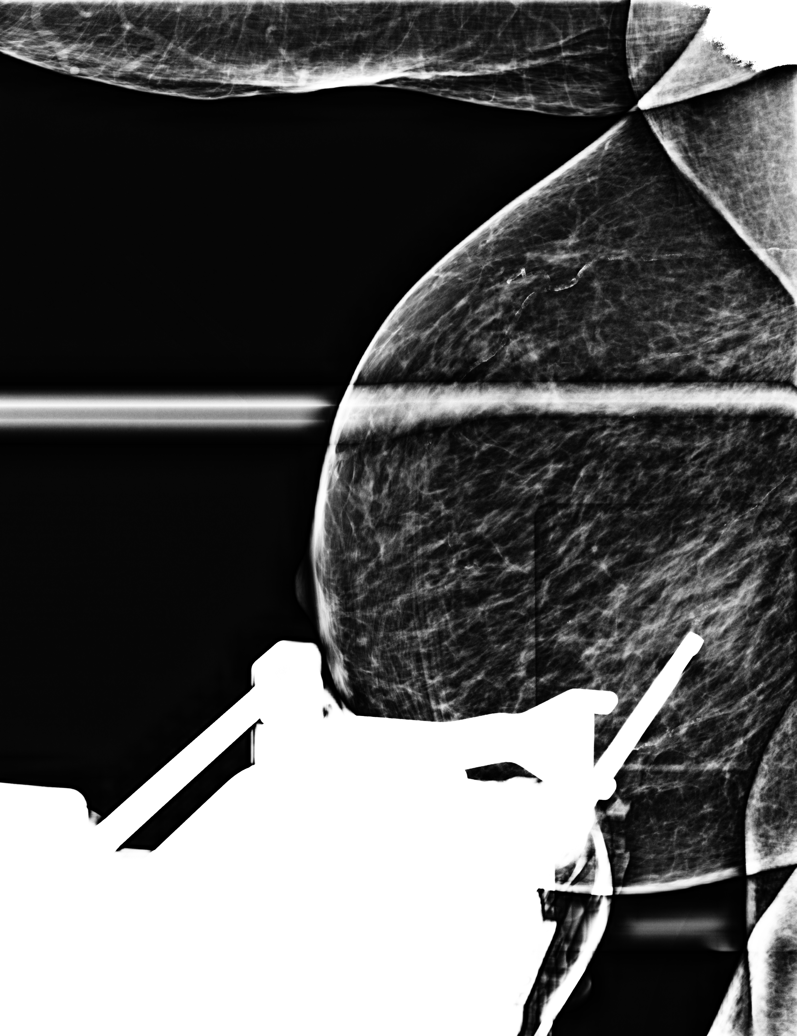

[6 of 26 positions shown; findings below may reference images not displayed]



Using sterile technique and 1% lidocaine and 1% lidocaine with
epinephrine as local anesthetic, under stereotactic guidance, a 9
gauge vacuum assisted device was used to perform core needle biopsy
of calcifications in the LOWER OUTER QUADRANT of the LEFT breast
using a LATERAL to the approach. Specimen radiograph was performed
showing calcifications in multiple tissue samples. Specimens with
calcifications are identified for pathology.

Lesion quadrant: LOWER OUTER QUADRANT LEFT breast

At the conclusion of the procedure, coil shaped tissue marker clip
was deployed into the biopsy cavity. Follow-up 2-view mammogram was
performed and dictated separately.
IMPRESSION: Stereotactic-guided biopsy of LEFT breast calcifications. No
apparent complications.

ADDENDUM:
PATHOLOGY revealed: Breast, left, needle core biopsy, lower outer
quadrant, coil clip- FIBROADENOMATOID CHANGE WITH DYSTROPHIC
CALCIFICATIONS. SEE NOTE - NEGATIVE FOR CARCINOMA.

Pathology results are CONCORDANT with imaging findings, per Dr.
Apple Tiger.

Pathology results and recommendations were discussed with patient
via telephone by Gordana Virdi RN on 02/19/2021. Patient reported doing
well after the biopsy with no adverse symptoms, and only slight
tenderness at the site. Post biopsy care instructions were reviewed,
questions were answered and my direct phone number was provided.
Patient was instructed to call [REDACTED]
for any additional questions or concerns related to biopsy site.

Recommendation: Resume annual bilateral screening mammogram due December 2021.

Pathology results reported by Botyo Alarbid RN on 02/19/2021.



Using sterile technique and 1% lidocaine and 1% lidocaine with
epinephrine as local anesthetic, under stereotactic guidance, a 9
gauge vacuum assisted device was used to perform core needle biopsy
of calcifications in the LOWER OUTER QUADRANT of the LEFT breast
using a LATERAL to the approach. Specimen radiograph was performed
showing calcifications in multiple tissue samples. Specimens with
calcifications are identified for pathology.

Lesion quadrant: LOWER OUTER QUADRANT LEFT breast

At the conclusion of the procedure, coil shaped tissue marker clip
was deployed into the biopsy cavity. Follow-up 2-view mammogram was
performed and dictated separately.
IMPRESSION: Stereotactic-guided biopsy of LEFT breast calcifications. No
apparent complications.

## 2022-06-27 IMAGING — US US RENAL
1 series · 14 of 25 positions shown · non-contrast
Comparison: None.

CLINICAL DATA: Chronic renal disease (unspecified stage).

EXAM:
RENAL / URINARY TRACT ULTRASOUND COMPLETE

[Series 1: us renal · 0.22mm/px · 14 of 48 slices shown]
[im 1/48]
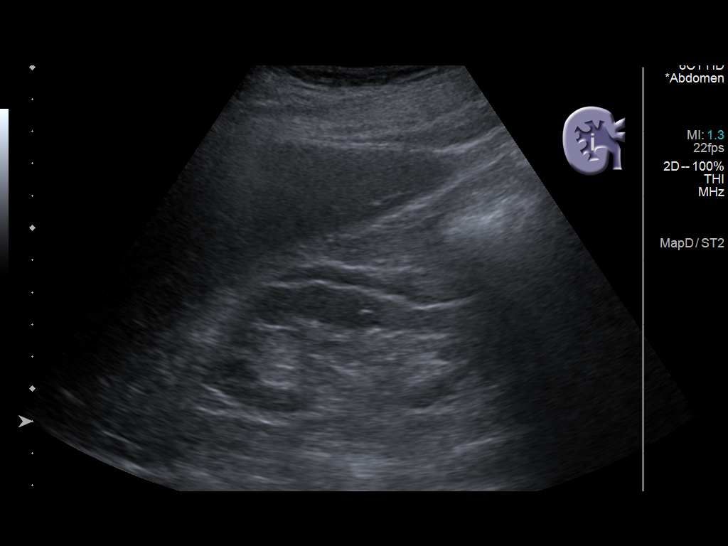
[im 4/48]
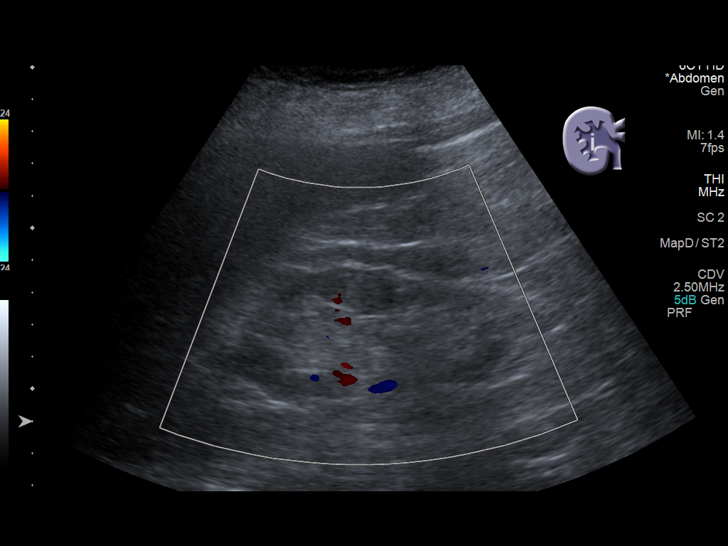
[im 8/48]
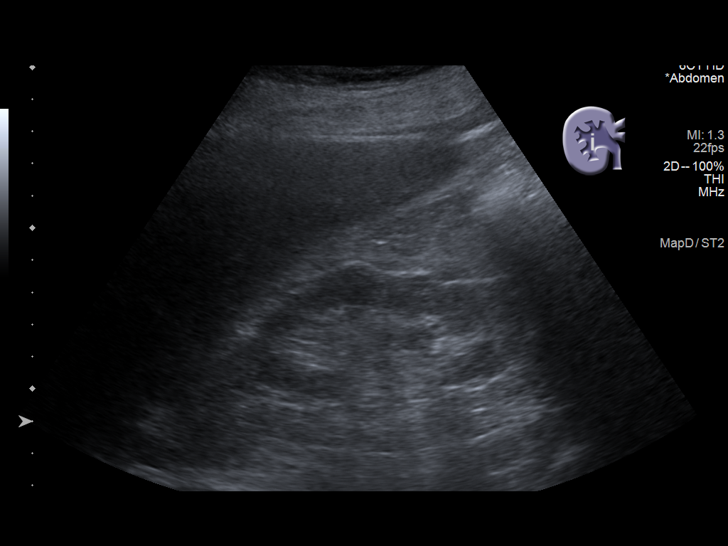
[im 12/48]
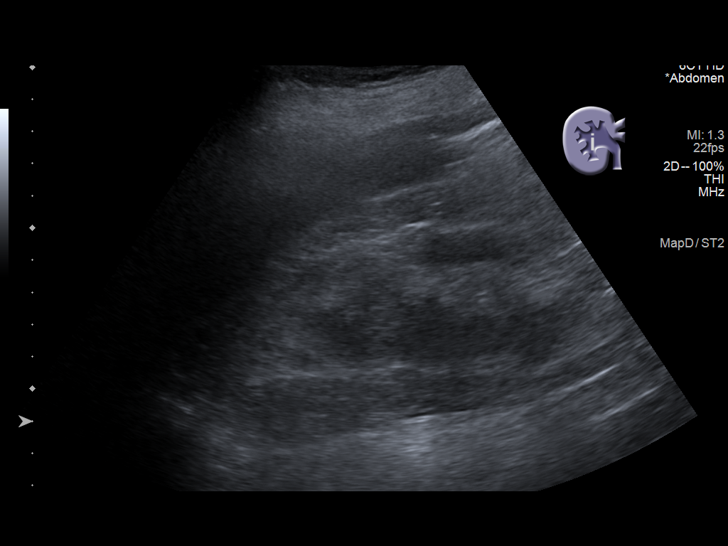
[im 16/48]
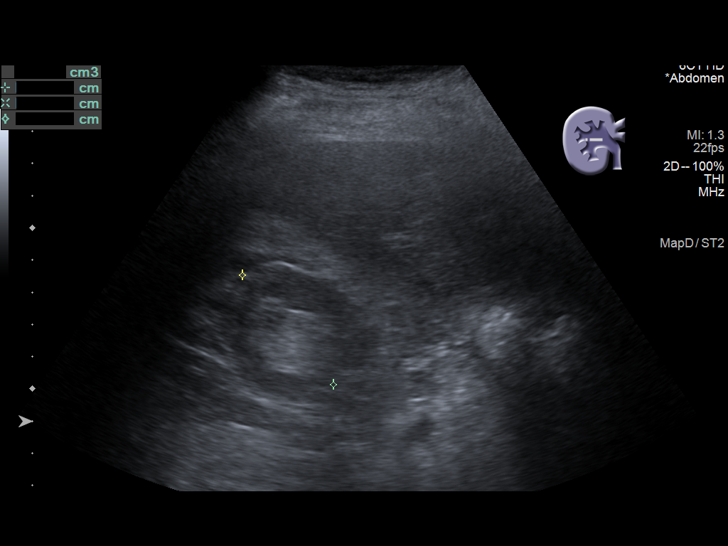
[im 18/48]
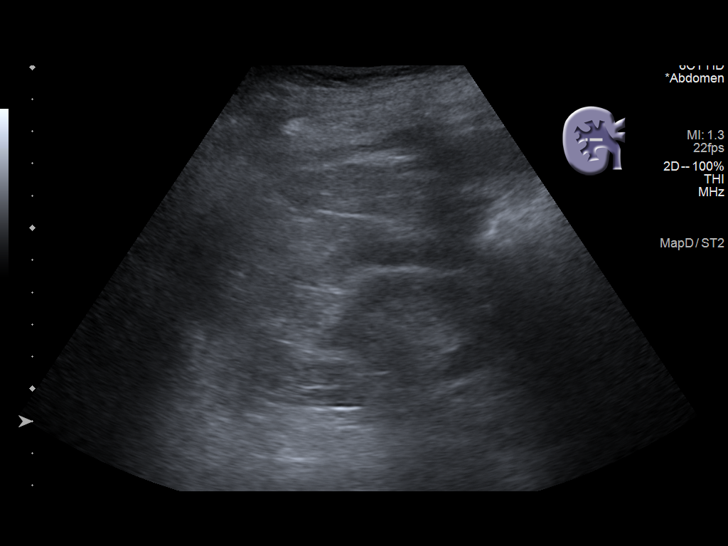
[im 22/48]
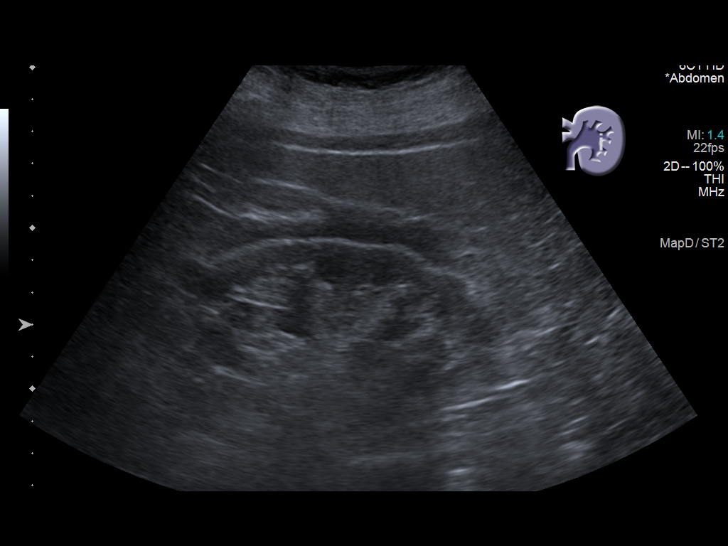
[im 26/48]
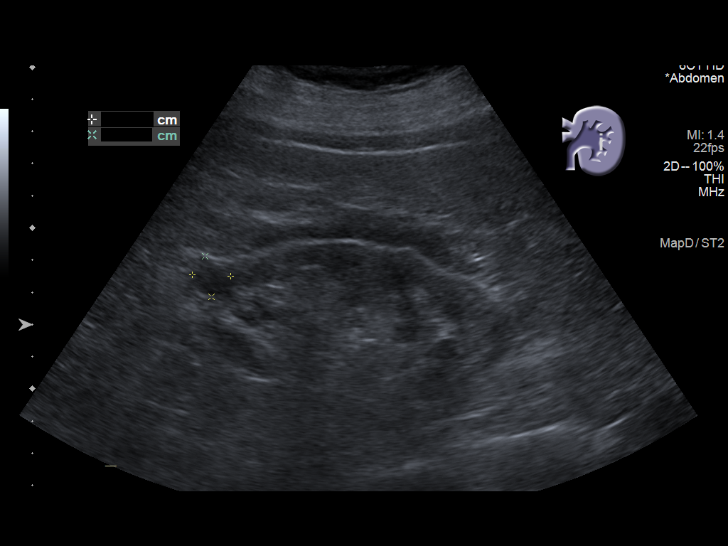
[im 30/48]
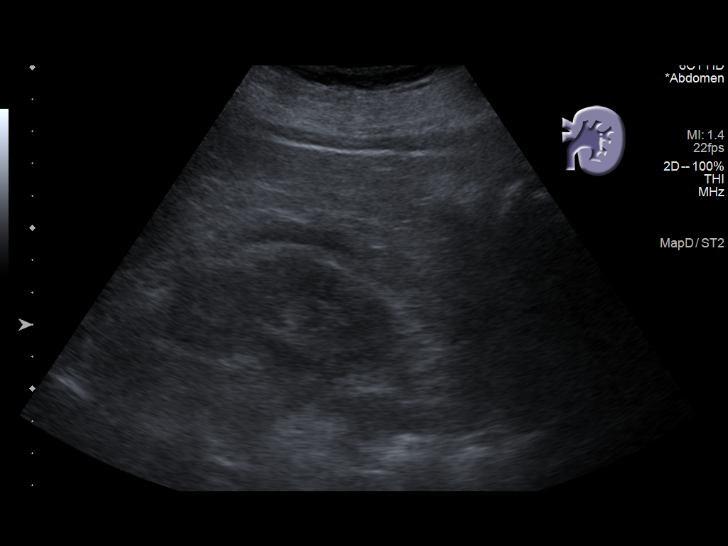
[im 32/48]
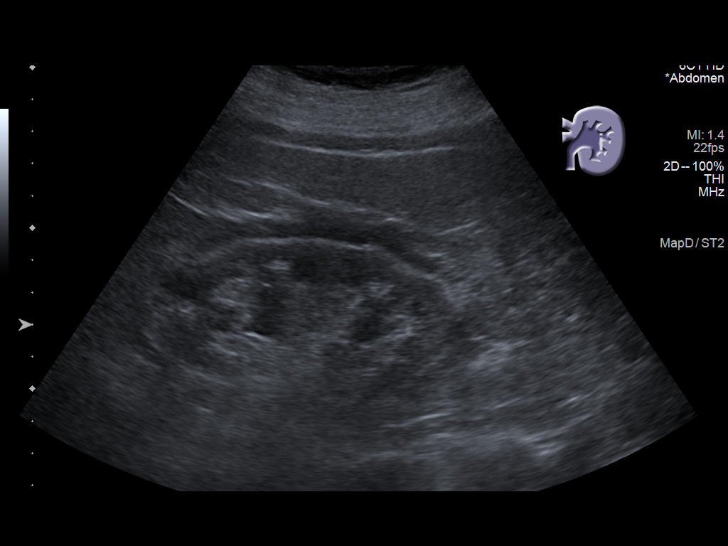
[im 36/48]
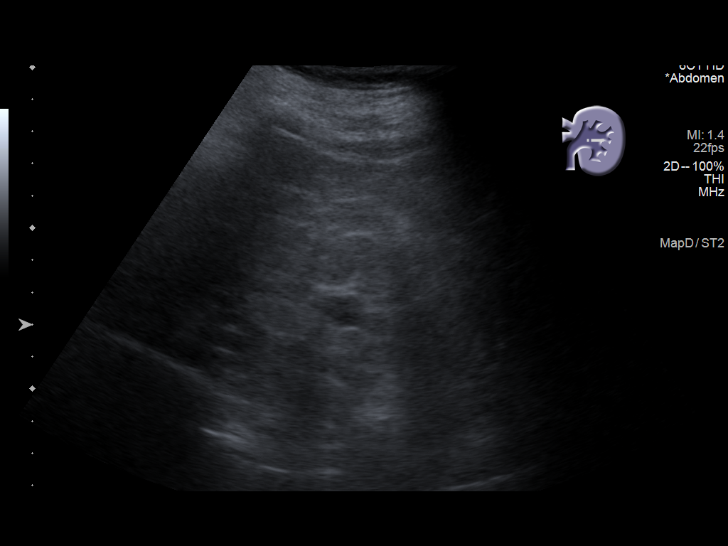
[im 40/48]
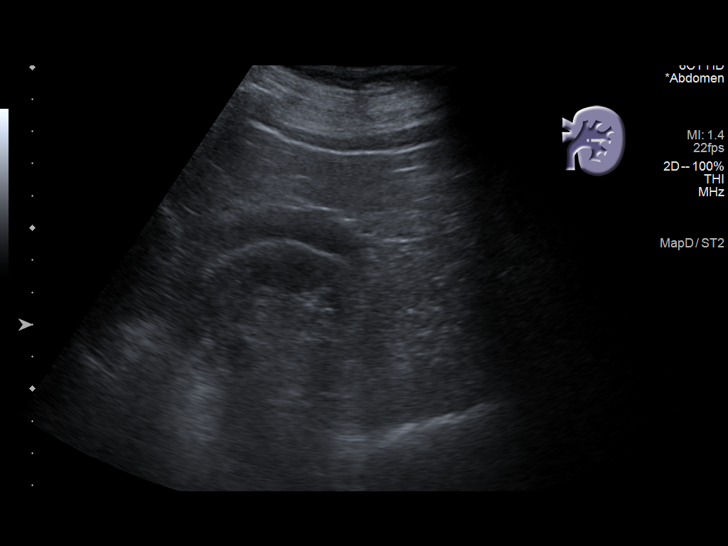
[im 44/48]
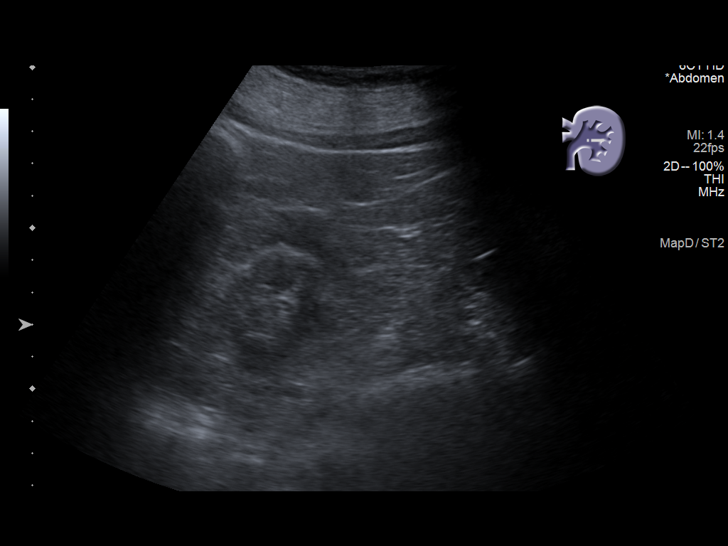
[im 48/48]
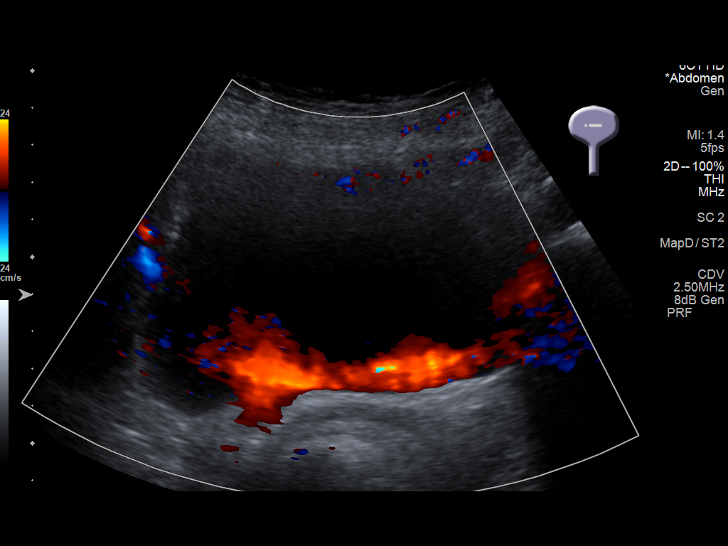

[14 of 25 positions shown; findings below may reference images not displayed]

FINDINGS: Right Kidney:

Renal measurements: 9.5 cm x 4.1 cm x 4.4 cm = volume: 89.7 mL.
Echogenicity within normal limits. No mass or hydronephrosis
visualized.

Left Kidney:

Renal measurements: 9.8 cm x 4.7 cm x 4.3 cm = volume: 103.6 mL.
Echogenicity within normal limits. A 1.2 cm x 1.3 cm 1.3 cm anechoic
structure is seen within the upper pole of the left kidney. No
abnormal flow is seen within this region on color Doppler
evaluation. No hydronephrosis is visualized.

Bladder:

Appears normal for degree of bladder distention.

Other:

A mild amount of bilateral perinephric fluid is noted.
IMPRESSION: 1. Small, simple left renal cyst. No additional follow-up or imaging
is recommended.
2. Small amount of bilateral perinephric fluid.

## 2022-09-01 ENCOUNTER — Other Ambulatory Visit (HOSPITAL_COMMUNITY): Payer: Self-pay | Admitting: Physical Medicine and Rehabilitation

## 2022-09-01 DIAGNOSIS — M5416 Radiculopathy, lumbar region: Secondary | ICD-10-CM

## 2022-09-18 ENCOUNTER — Ambulatory Visit (HOSPITAL_COMMUNITY)
Admission: RE | Admit: 2022-09-18 | Discharge: 2022-09-18 | Disposition: A | Discharge status: Home / Self Care | Payer: Medicare Other | Source: Ambulatory Visit | Attending: Physical Medicine and Rehabilitation | Admitting: Physical Medicine and Rehabilitation

## 2022-09-18 DIAGNOSIS — M5416 Radiculopathy, lumbar region: Secondary | ICD-10-CM
